# Patient Record
Sex: Female | Born: 1980 | Race: Black or African American | Hispanic: No | Marital: Married | Smoking: Never smoker
Health system: Southern US, Community
[De-identification: ages and names within clinical notes are randomized; demographics above are authoritative.]

---

## 2002-10-22 ENCOUNTER — Encounter: Payer: Self-pay | Admitting: Emergency Medicine

## 2002-10-22 ENCOUNTER — Emergency Department (HOSPITAL_COMMUNITY): Admission: EM | Admit: 2002-10-22 | Discharge: 2002-10-22 | Payer: Self-pay | Admitting: Emergency Medicine

## 2003-09-11 ENCOUNTER — Ambulatory Visit (HOSPITAL_COMMUNITY): Admission: RE | Admit: 2003-09-11 | Discharge: 2003-09-11 | Payer: Self-pay | Admitting: *Deleted

## 2008-05-23 ENCOUNTER — Ambulatory Visit: Payer: Self-pay | Admitting: Diagnostic Radiology

## 2008-05-23 ENCOUNTER — Emergency Department (HOSPITAL_BASED_OUTPATIENT_CLINIC_OR_DEPARTMENT_OTHER): Admission: EM | Admit: 2008-05-23 | Discharge: 2008-05-24 | Payer: Self-pay | Admitting: Emergency Medicine

## 2010-02-14 IMAGING — CT CT PELVIS W/ CM
2 of 5 series · 16 of 46 positions shown, 18 images · IV contrast (APPLIED)
Comparison: None

CT ABDOMEN

CLINICAL DATA: Lower abdominal and pelvic pain.  Vomiting.

CT ABDOMEN AND PELVIS WITH CONTRAST
TECHNIQUE: Multidetector CT imaging of the abdomen and pelvis was
performed using the standard protocol following bolus
administration of intravenous contrast.
Contrast: 100 ml [id number]-7JJ and oral contrast

[Series 2: abd/pelvis 5.0 b31f · axial · 0.83mm/px · z∈[-489,-49]mm · 13 of 104 slices shown, 15 images]
[im 8/104  soft-tissue]
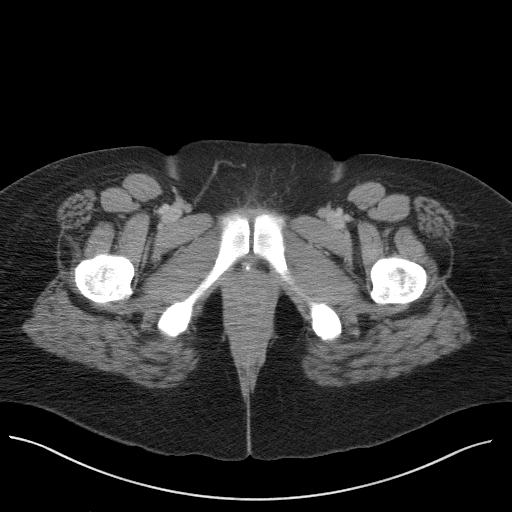
[im 8/104  bone]
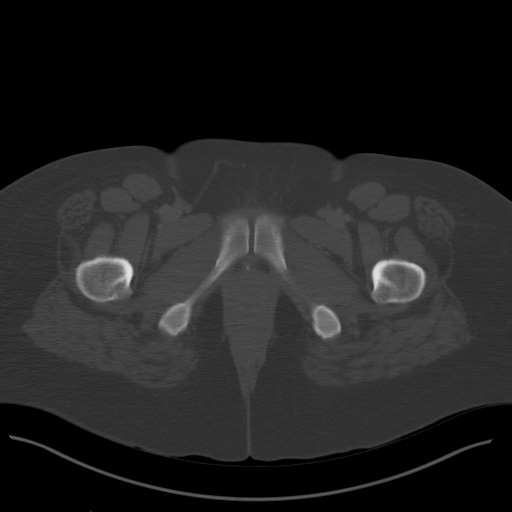
[im 15/104  soft-tissue]
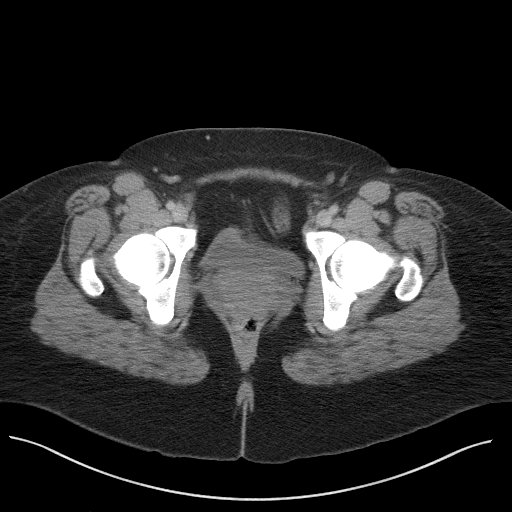
[im 23/104  soft-tissue]
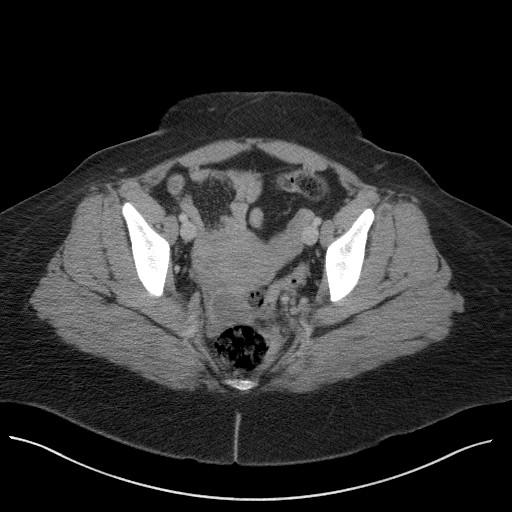
[im 30/104  soft-tissue]
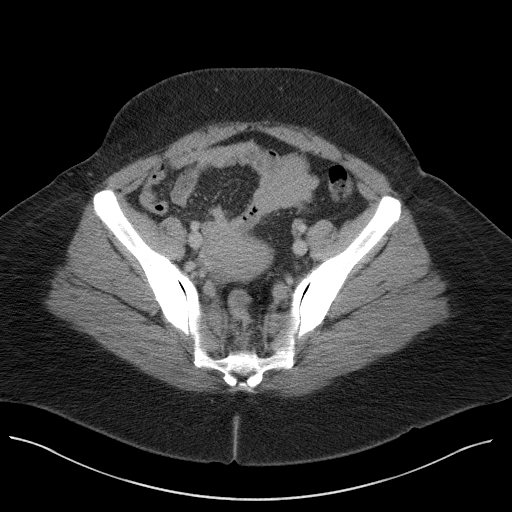
[im 37/104  soft-tissue]
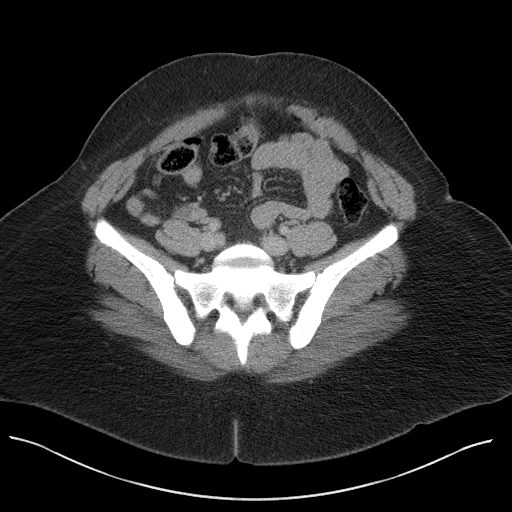
[im 45/104  soft-tissue]
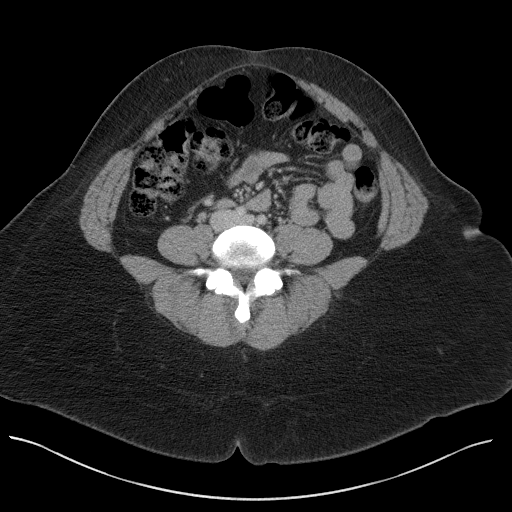
[im 52/104  soft-tissue]
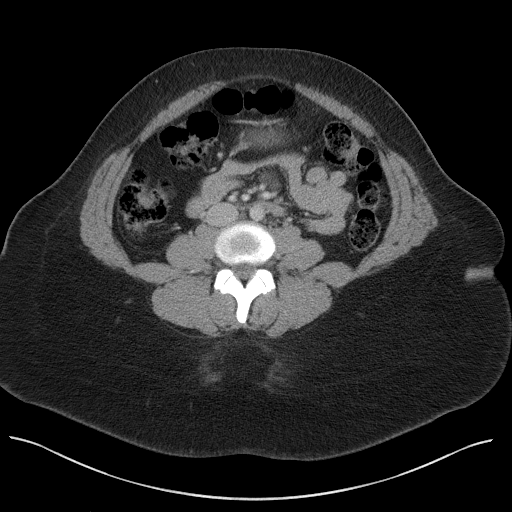
[im 59/104  soft-tissue]
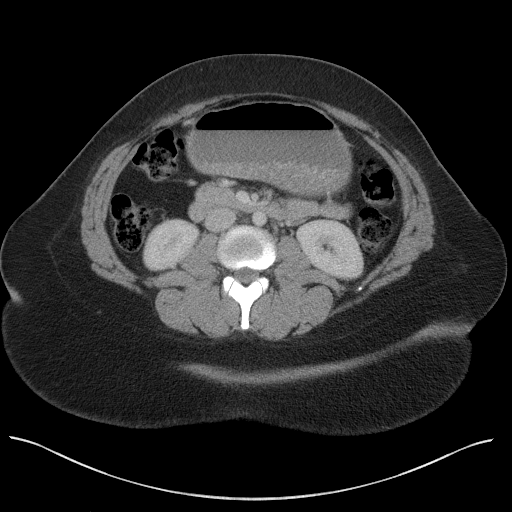
[im 67/104  soft-tissue]
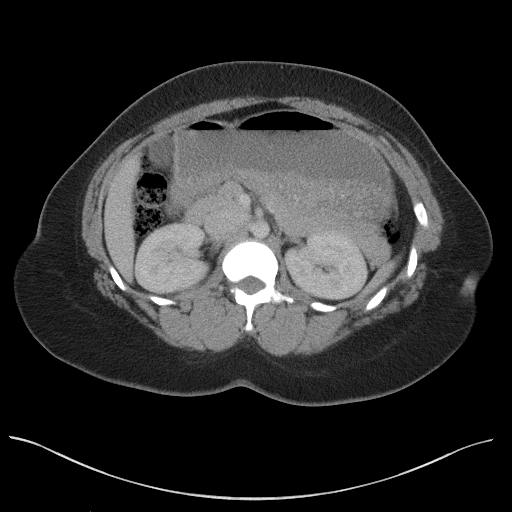
[im 67/104  bone]
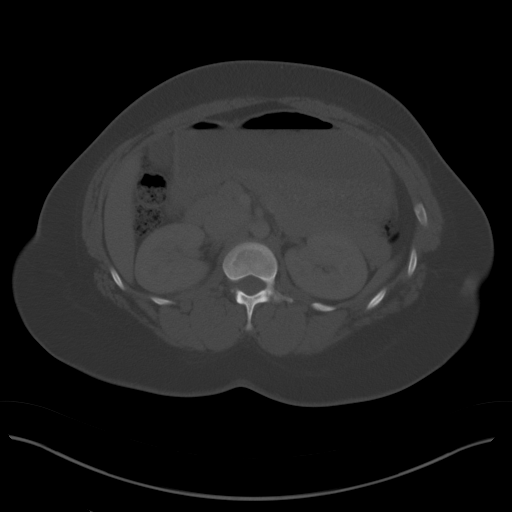
[im 74/104  soft-tissue]
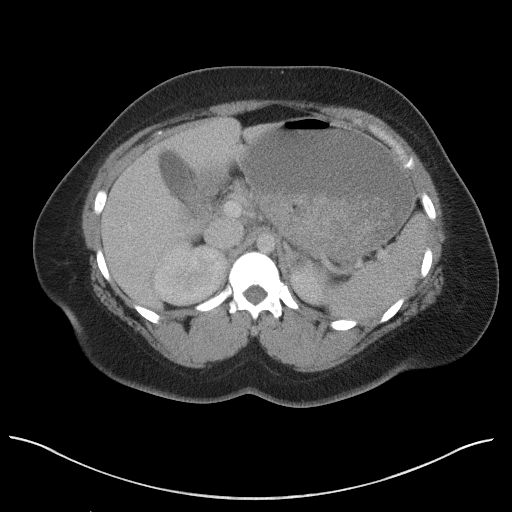
[im 81/104  soft-tissue]
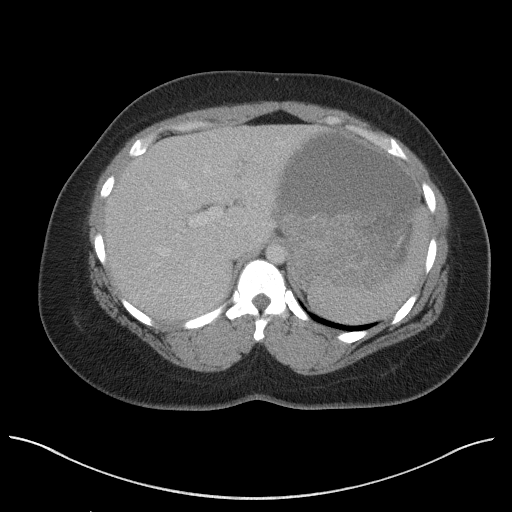
[im 89/104  soft-tissue]
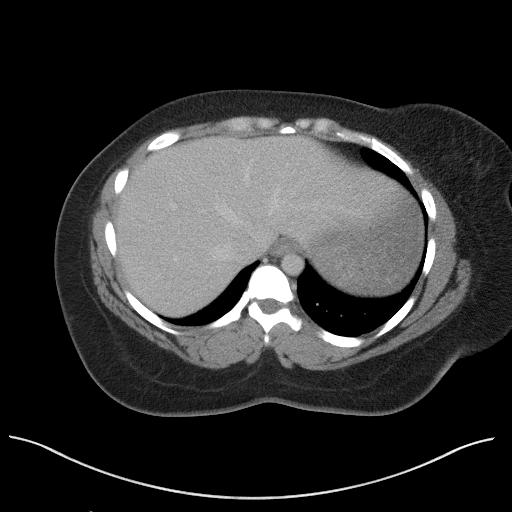
[im 96/104  soft-tissue]
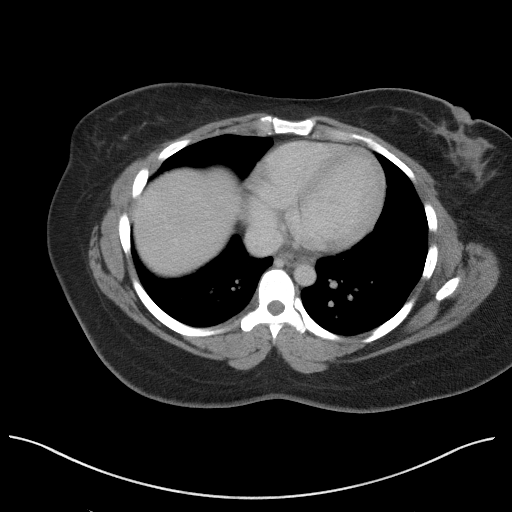

[Series 5: abd/pelvis 3.0 coronal · coronal · 0.95mm/px · 3 of 91 slices shown]
[im 31/91  soft-tissue]
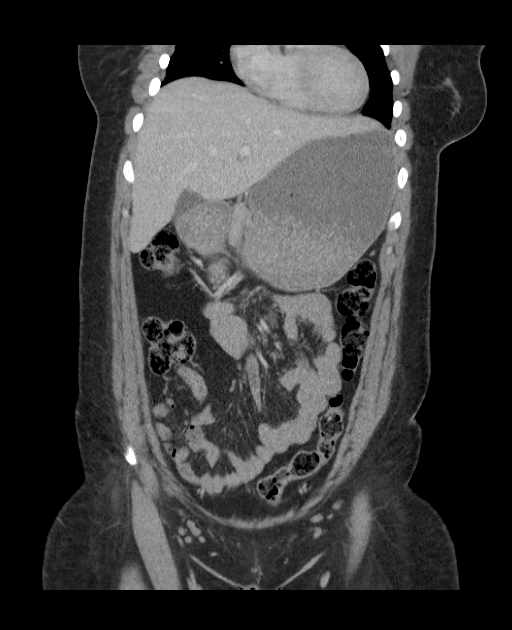
[im 41/91  soft-tissue]
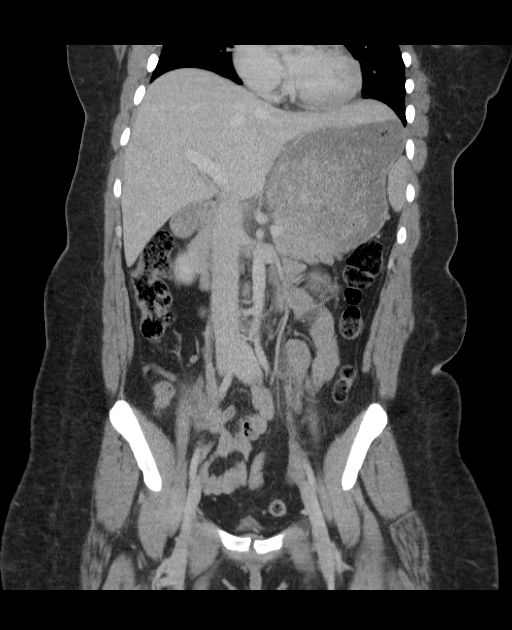
[im 51/91  soft-tissue]
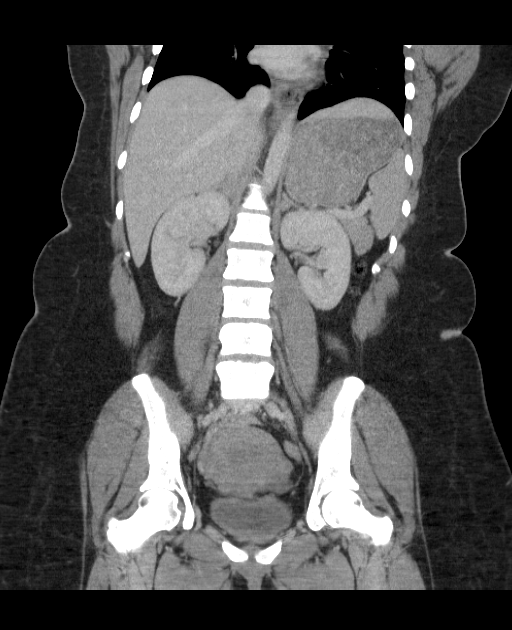

[16 of 46 positions shown; findings below may reference images not displayed]

FINDINGS: The abdominal parenchymal organs are normal appearance.
Gallbladder is unremarkable, and there is no evidence of
hydronephrosis.

Stomach is distended, however there is no evidence of dilated bowel
loops.  There is no evidence of mass or inflammatory process.  No
abnormal fluid collections are seen.
IMPRESSION: 1.  Distended stomach, without evidence of dilated bowel loops.
This is a nonspecific finding, however gastroparesis of gastric
outlet obstruction cannot definitely be excluded.
2.  Otherwise unremarkable abdomen CT.

CT PELVIS
FINDINGS: A unilocular cyst is seen in the right adnexa measuring
3.2 cm.  This likely represents a functional ovarian cyst.  There
is no evidence of free fluid.  No inflammatory process identified.
Normal appendix is visualized.  There is no evidence of pelvic soft
tissue masses.
IMPRESSION: 3.2 cm right adnexal cyst, likely representing a functional ovarian
cyst.  No evidence of free fluid or other significant abnormality
in the pelvis.

## 2010-02-16 ENCOUNTER — Emergency Department (HOSPITAL_BASED_OUTPATIENT_CLINIC_OR_DEPARTMENT_OTHER)
Admission: EM | Admit: 2010-02-16 | Discharge: 2010-02-16 | Payer: Self-pay | Source: Home / Self Care | Admitting: Emergency Medicine

## 2010-02-16 LAB — CBC
HCT: 35.3 % — ABNORMAL LOW (ref 36.0–46.0)
Hemoglobin: 11.4 g/dL — ABNORMAL LOW (ref 12.0–15.0)
MCV: 81.3 fL (ref 78.0–100.0)
RBC: 4.34 MIL/uL (ref 3.87–5.11)
RDW: 12.7 % (ref 11.5–15.5)
WBC: 6.2 10*3/uL (ref 4.0–10.5)

## 2010-02-16 LAB — BASIC METABOLIC PANEL
Calcium: 9.2 mg/dL (ref 8.4–10.5)
Chloride: 109 mEq/L (ref 96–112)
GFR calc Af Amer: >60 mL/min (ref >60)
GFR calc non Af Amer: >60 mL/min (ref >60)
Potassium: 4.3 mEq/L (ref 3.5–5.1)

## 2010-05-03 LAB — COMPREHENSIVE METABOLIC PANEL
ALT: 12 U/L (ref 0–35)
AST: 20 U/L (ref 0–37)
CO2: 26 mEq/L (ref 19–32)
Calcium: 9.5 mg/dL (ref 8.4–10.5)
Creatinine, Ser: 0.8 mg/dL (ref 0.4–1.2)
GFR calc Af Amer: >60 mL/min (ref >60)
GFR calc non Af Amer: >60 mL/min (ref >60)
Glucose, Bld: 114 mg/dL — ABNORMAL HIGH (ref 70–99)
Sodium: 141 mEq/L (ref 135–145)
Total Protein: 8.6 g/dL — ABNORMAL HIGH (ref 6.0–8.3)

## 2010-05-03 LAB — URINALYSIS, ROUTINE W REFLEX MICROSCOPIC
Leukocytes, UA: NEGATIVE
Nitrite: NEGATIVE
Specific Gravity, Urine: 1.034 — ABNORMAL HIGH (ref 1.005–1.030)
Urobilinogen, UA: 1 mg/dL (ref 0.0–1.0)
pH: 6.5 (ref 5.0–8.0)

## 2010-05-03 LAB — CBC
MCHC: 32.8 g/dL (ref 30.0–36.0)
MCV: 79.2 fL (ref 78.0–100.0)
RBC: 4.45 MIL/uL (ref 3.87–5.11)
RDW: 14.2 % (ref 11.5–15.5)

## 2010-05-03 LAB — DIFFERENTIAL
Lymphocytes Relative: 35 % (ref 12–46)
Lymphs Abs: 4 10*3/uL (ref 0.7–4.0)
Monocytes Relative: 7 % (ref 3–12)
Neutrophils Relative %: 53 % (ref 43–77)

## 2010-05-03 LAB — URINE MICROSCOPIC-ADD ON

## 2010-05-03 LAB — PREGNANCY, URINE: Preg Test, Ur: NEGATIVE

## 2010-05-03 LAB — WET PREP, GENITAL: Trich, Wet Prep: NONE SEEN

## 2010-05-03 LAB — GC/CHLAMYDIA PROBE AMP, GENITAL
Chlamydia, DNA Probe: NEGATIVE
GC Probe Amp, Genital: NEGATIVE

## 2010-05-03 LAB — LIPASE, BLOOD: Lipase: 85 U/L (ref 23–300)

## 2010-07-18 ENCOUNTER — Emergency Department (HOSPITAL_BASED_OUTPATIENT_CLINIC_OR_DEPARTMENT_OTHER)
Admission: EM | Admit: 2010-07-18 | Discharge: 2010-07-18 | Disposition: A | Discharge status: Home / Self Care | Payer: Medicaid Other | Attending: Emergency Medicine | Admitting: Emergency Medicine

## 2010-07-18 DIAGNOSIS — J039 Acute tonsillitis, unspecified: Secondary | ICD-10-CM | POA: Insufficient documentation

## 2011-03-09 ENCOUNTER — Emergency Department (HOSPITAL_BASED_OUTPATIENT_CLINIC_OR_DEPARTMENT_OTHER)
Admission: EM | Admit: 2011-03-09 | Discharge: 2011-03-10 | Disposition: A | Discharge status: Home / Self Care | Payer: Medicaid Other | Attending: Emergency Medicine | Admitting: Emergency Medicine

## 2011-03-09 ENCOUNTER — Encounter (HOSPITAL_BASED_OUTPATIENT_CLINIC_OR_DEPARTMENT_OTHER): Payer: Self-pay | Admitting: *Deleted

## 2011-03-09 DIAGNOSIS — H9209 Otalgia, unspecified ear: Secondary | ICD-10-CM | POA: Insufficient documentation

## 2011-03-09 DIAGNOSIS — K089 Disorder of teeth and supporting structures, unspecified: Secondary | ICD-10-CM | POA: Insufficient documentation

## 2011-03-09 DIAGNOSIS — K0889 Other specified disorders of teeth and supporting structures: Secondary | ICD-10-CM

## 2011-03-09 DIAGNOSIS — Z79899 Other long term (current) drug therapy: Secondary | ICD-10-CM | POA: Insufficient documentation

## 2011-03-09 MED ORDER — AMOXICILLIN-POT CLAVULANATE 875-125 MG PO TABS
1.0000 | ORAL_TABLET | Freq: Once | ORAL | Status: AC
  Administered 2011-03-09: 1 via ORAL
  Filled 2011-03-09: qty 1

## 2011-03-09 MED ORDER — AMOXICILLIN-POT CLAVULANATE 875-125 MG PO TABS: 1.0000 | ORAL_TABLET | Freq: Two times a day (BID) | ORAL | Status: AC

## 2011-03-09 MED ORDER — FLUTICASONE PROPIONATE 50 MCG/ACT NA SUSP
2.0000 | Freq: Every day | NASAL | Status: DC
  Filled 2011-03-09: qty 16

## 2011-03-09 MED ORDER — FLUTICASONE PROPIONATE 50 MCG/ACT NA SUSP: 2.0000 | Freq: Every day | NASAL | Status: AC

## 2011-03-09 NOTE — ED Notes (Signed)
C/o right tooth pain x 3 weeks which is now causing ear pain as well.

## 2011-03-09 NOTE — ED Notes (Signed)
Asked Pt. About if she has a Education officer, community.  Pt. Reports she has no dentist or PMD.  [facility]

## 2011-03-09 NOTE — ED Provider Notes (Signed)
History     CSN: [id number]  Arrival date & time 03/09/11  2157   First MD Initiated Contact with Patient 03/09/11 2324      Chief Complaint  Patient presents with  . Dental Pain  . Otalgia     HPI The patient presents with 3 weeks of pain about the right side of her face.  She notes her pain began gradually, focally in her right superior posterior teeth.  Since onset the pain has spread to encompass the rest of her face, including her ear on the right side.  The pain is throbbing, worse with chewing and air going into her ear.  The pain is not significant relief with OTC medications. No dysphagia no dysphagia, no dyspnea.  No fevers, no chills. History reviewed. No pertinent past medical history.  History reviewed. No pertinent past surgical history.  History reviewed. No pertinent family history.  History  Substance Use Topics  . Smoking status: Never Smoker   . Smokeless tobacco: Not on file  . Alcohol Use: No    OB History    Grav Para Term Preterm Abortions TAB SAB Ect Mult Living                  Review of Systems  All other systems reviewed and are negative.    Allergies  Review of patient's allergies indicates no known allergies.  Home Medications   Current Outpatient Rx  Name Route Sig Dispense Refill  . ACETAMINOPHEN 500 MG PO TABS Oral Take 1,500 mg by mouth every 6 (six) hours as needed. For pain    . ETONOGESTREL 68 MG [place] IMPL Subcutaneous Inject 1 each into the skin once. Implanted in 2011    . IBUPROFEN 200 MG PO TABS Oral Take 800 mg by mouth every 6 (six) hours as needed. For pain    . AMOXICILLIN-POT CLAVULANATE 875-125 MG PO TABS Oral Take 1 tablet by mouth 2 (two) times daily. 14 tablet 0    BP 140/77  Pulse 71  Temp(Src) 98.1 F (36.7 C) (Oral)  Resp 18  Ht 5\' 7"  (1.702 m)  Wt 256 lb (116.121 kg)  BMI 40.10 kg/m2  SpO2 100%  Physical Exam  Nursing note and vitals reviewed. Constitutional: She is oriented to person, place, and  time. She appears well-developed and well-nourished. No distress.  HENT:  Head: Normocephalic and atraumatic. No trismus in the jaw.  Right Ear: External ear and ear canal normal. No mastoid tenderness. Tympanic membrane is not injected, not scarred, not perforated, not erythematous, not retracted and not bulging. A middle ear effusion is present. No hemotympanum.  Left Ear: Tympanic membrane, external ear and ear canal normal.  No middle ear effusion.  Mouth/Throat: Uvula is midline, oropharynx is clear and moist and mucous membranes are normal. Abnormal dentition. Dental caries present. No dental abscesses or uvula swelling.  Eyes: Conjunctivae and EOM are normal.  Cardiovascular: Normal rate and regular rhythm.   Pulmonary/Chest: Effort normal and breath sounds normal. No stridor. No respiratory distress.  Musculoskeletal: She exhibits no edema.  Neurological: She is alert and oriented to person, place, and time. No cranial nerve deficit.  Skin: Skin is warm and dry.  Psychiatric: She has a normal mood and affect.    ED Course  Procedures (including critical care time)  Labs Reviewed - No data to display No results found.   1. Pain, dental   2. Ear pain       MDM  This  generally well female now presents with several weeks of pain about the right side of her face.  On exam she is in no distress, though there is a mild right ear effusion and abnormal dentition on the right.  There is no overt evidence of abscess, though there is some concern for periapical pathology.  This concern was shared with the patient and her colleague.  She was advised to get a cat scan was required to effectively rule out abscess.  The patient defers CAT scan, and will be discharged with antibiotics.  Explicit return precautions, specifically; return in 3 days for no improvement in her condition, or sooner if there is any deterioration, were provided.        Gerhard Munch, MD 03/09/11 [phone]

## 2011-03-09 NOTE — Discharge Instructions (Signed)
As we discussed, your condition is likely due to an infection of your sinuses.  However there is the possibility of this being an early dental infection with an abscess.  It is very important that you take your medications as directed.  And, as discussed, if you develop any new, or concerning changes in her condition please return to the emergency department.  You also need to follow up with a dentist.  Referral to our dental clinic has been provided

## 2011-04-28 ENCOUNTER — Encounter (HOSPITAL_BASED_OUTPATIENT_CLINIC_OR_DEPARTMENT_OTHER): Payer: Self-pay | Admitting: *Deleted

## 2011-04-28 ENCOUNTER — Emergency Department (HOSPITAL_BASED_OUTPATIENT_CLINIC_OR_DEPARTMENT_OTHER)
Admission: EM | Admit: 2011-04-28 | Discharge: 2011-04-28 | Disposition: A | Discharge status: Home / Self Care | Payer: Self-pay | Attending: Emergency Medicine | Admitting: Emergency Medicine

## 2011-04-28 DIAGNOSIS — K029 Dental caries, unspecified: Secondary | ICD-10-CM | POA: Insufficient documentation

## 2011-04-28 DIAGNOSIS — M26629 Arthralgia of temporomandibular joint, unspecified side: Secondary | ICD-10-CM

## 2011-04-28 MED ORDER — HYDROCODONE-ACETAMINOPHEN 5-325 MG PO TABS
1.0000 | ORAL_TABLET | Freq: Four times a day (QID) | ORAL | Status: AC | PRN
Start: 2011-04-28 — End: 2011-05-08

## 2011-04-28 MED ORDER — DIAZEPAM 5 MG PO TABS: 5.0000 mg | ORAL_TABLET | Freq: Three times a day (TID) | ORAL | Status: AC | PRN

## 2011-04-28 NOTE — ED Provider Notes (Signed)
History     CSN: [id number]  Arrival date & time 04/28/11  0300   First MD Initiated Contact with Patient 04/28/11 [phone]      Chief Complaint  Patient presents with  . Dental Pain    (Consider location/radiation/quality/duration/timing/severity/associated sxs/prior treatment) HPI Level 5 Caveat: crying, unable to cooperate. This is a 31 year old female with a history of dental pain for over a month. When asked where the pain is located she points to her right temporomandibular joint. When asked if any individual teeth hurt she states they all hurt and cannot localize the pain to any tooth. She states the pain is severe. The pain is worse with moving the jaw. She denies fevers. She's been taking over-the-counter medications without relief.  History reviewed. No pertinent past medical history.  History reviewed. No pertinent past surgical history.  No family history on file.  History  Substance Use Topics  . Smoking status: Never Smoker   . Smokeless tobacco: Not on file  . Alcohol Use: No    OB History    Grav Para Term Preterm Abortions TAB SAB Ect Mult Living                  Review of Systems  All other systems reviewed and are negative.    Allergies  Review of patient's allergies indicates no known allergies.  Home Medications   Current Outpatient Rx  Name Route Sig Dispense Refill  . ACETAMINOPHEN 500 MG PO TABS Oral Take 1,500 mg by mouth every 6 (six) hours as needed. For pain    . ETONOGESTREL 68 MG [place] IMPL Subcutaneous Inject 1 each into the skin once. Implanted in 2011    . FLUTICASONE PROPIONATE 50 MCG/ACT NA SUSP Nasal Place 2 sprays into the nose daily. 16 g 0  . IBUPROFEN 200 MG PO TABS Oral Take 800 mg by mouth every 6 (six) hours as needed. For pain      BP 143/77  Pulse 82  Temp(Src) 98.6 F (37 C) (Oral)  Resp 18  Ht 5\' 7"  (1.702 m)  Wt 255 lb (115.667 kg)  BMI 39.94 kg/m2  SpO2 100%  LMP 04/28/2011  Physical Exam General:  Well-developed, well-nourished female in no acute distress; appearance consistent with age of record HENT: normocephalic, atraumatic; carious right upper third molar without tenderness to percussion; tender right TMJ, worse on movement of jaw Eyes: pupils equal round and reactive to light; extraocular muscles intact Neck: supple Heart: regular rate and rhythm Lungs: Normal respiratory effort and excursion Abdomen: soft; nondistended Extremities: No deformity; full range of motion; pulses normal Neurologic: Awake, alert; motor function intact in all extremities and symmetric; no facial droop Skin: Warm and dry Psychiatric: Crying, covering face and head with towels    ED Course  Procedures (including critical care time)     MDM  The patient was advised that she needs to followup with a dentist. She was advised that the dentist on call will see her without payment up front providing she calls within 48 hours. This is the policy of the [facility] dental service.        Hanley Seamen, MD 04/28/11 [phone]

## 2011-04-28 NOTE — ED Notes (Signed)
C/o right sided toothache that radiates into the ear. Constant for over a month, worsened over the last few days. Denies fevers. Has been taking OTC meds without relief. Pt cannot afford a dentist.

## 2014-05-02 ENCOUNTER — Encounter (HOSPITAL_BASED_OUTPATIENT_CLINIC_OR_DEPARTMENT_OTHER): Payer: Self-pay

## 2014-05-02 ENCOUNTER — Emergency Department (HOSPITAL_BASED_OUTPATIENT_CLINIC_OR_DEPARTMENT_OTHER)
Admission: EM | Admit: 2014-05-02 | Discharge: 2014-05-02 | Disposition: A | Discharge status: Home / Self Care | Payer: Medicaid Other | Attending: Emergency Medicine | Admitting: Emergency Medicine

## 2014-05-02 DIAGNOSIS — K029 Dental caries, unspecified: Secondary | ICD-10-CM | POA: Diagnosis not present

## 2014-05-02 DIAGNOSIS — Z7951 Long term (current) use of inhaled steroids: Secondary | ICD-10-CM | POA: Insufficient documentation

## 2014-05-02 DIAGNOSIS — Z792 Long term (current) use of antibiotics: Secondary | ICD-10-CM | POA: Insufficient documentation

## 2014-05-02 DIAGNOSIS — K088 Other specified disorders of teeth and supporting structures: Secondary | ICD-10-CM | POA: Diagnosis present

## 2014-05-02 MED ORDER — CLINDAMYCIN HCL 300 MG PO CAPS: 300.0000 mg | ORAL_CAPSULE | Freq: Four times a day (QID) | ORAL | Status: AC

## 2014-05-02 MED ORDER — MAGIC MOUTHWASH: 5.0000 mL | Freq: Three times a day (TID) | ORAL | Status: AC

## 2014-05-02 MED ORDER — NAPROXEN 250 MG PO TABS
500.0000 mg | ORAL_TABLET | Freq: Once | ORAL | Status: AC
  Administered 2014-05-02: 500 mg via ORAL
  Filled 2014-05-02: qty 2

## 2014-05-02 MED ORDER — NAPROXEN 500 MG PO TABS: 500.0000 mg | ORAL_TABLET | Freq: Two times a day (BID) | ORAL | Status: AC

## 2014-05-02 MED ORDER — CLINDAMYCIN HCL 150 MG PO CAPS
300.0000 mg | ORAL_CAPSULE | Freq: Once | ORAL | Status: AC
  Administered 2014-05-02: 300 mg via ORAL
  Filled 2014-05-02: qty 2

## 2014-05-02 NOTE — ED Notes (Signed)
Pt reports left lower dental pain x 1 month.  No dental appt scheduled, states "i have no insurance."

## 2014-05-02 NOTE — ED Provider Notes (Signed)
CSN: [id number]     Arrival date & time 05/02/14  2244 History  This chart was scribed for Kristin Yankovich, MD by Tonye RoyaltyJoshua Chen, ED Scribe. This patient was seen in room MH01/MH01 and the patient's care was started at 11:02 PM.    Chief Complaint  Patient presents with  . Dental Pain   Patient is a 34 y.o. female presenting with tooth pain. The history is provided by the patient. No language interpreter was used.  Dental Pain Location:  Lower Lower teeth location:  17/LL 3rd molar and 18/LL 2nd molar Quality:  Unable to specify Severity:  Severe Onset quality:  Gradual Duration:  2 months Timing:  Intermittent Progression:  Worsening Chronicity:  New Context: dental caries   Previous work-up:  Dental exam Relieved by: Amoxicillin, Hydrocodone. Worsened by:  Nothing tried Ineffective treatments:  None tried Associated symptoms: no facial swelling, no neck swelling, no oral bleeding and no trismus   Risk factors: no smoking     HPI Comments: Kristin Cole is a 34 y.o. female who presents to the Emergency Department complaining of left lower dental pain, intermittent since 2 months ago. She states she saw a dentist who prescribed Hydrocodone and Amoxicillin which improved her symptoms, but they worsened again 3 days ago. She states she stopped using the Hydrocodone because it makes her sleepy. She reports headache to psterior head.   History reviewed. No pertinent past medical history. History reviewed. No pertinent past surgical history. No family history on file. History  Substance Use Topics  . Smoking status: Never Smoker   . Smokeless tobacco: Not on file  . Alcohol Use: No   OB History    No data available     Review of Systems  HENT: Positive for dental problem. Negative for facial swelling.   All other systems reviewed and are negative.     Allergies  Review of patient's allergies indicates no known allergies.  Home Medications   Prior to Admission  medications   Medication Sig Start Date End Date Taking? Authorizing Provider  amoxicillin (AMOXIL) 500 MG capsule Take 500 mg by mouth 3 (three) times daily.   Yes Historical Provider, MD  HYDROcodone-acetaminophen (NORCO/VICODIN) 5-325 MG per tablet Take 1 tablet by mouth every 6 (six) hours as needed for moderate pain.   Yes Historical Provider, MD  acetaminophen (TYLENOL) 500 MG tablet Take 1,500 mg by mouth every 6 (six) hours as needed. For pain    Historical Provider, MD  etonogestrel (IMPLANON) 68 MG IMPL implant Inject 1 each into the skin once. Implanted in 2011    Historical Provider, MD  fluticasone (FLONASE) 50 MCG/ACT nasal spray Place 2 sprays into the nose daily. 03/09/11 03/10/12  Gerhard Munchobert Lockwood, MD  ibuprofen (ADVIL,MOTRIN) 200 MG tablet Take 800 mg by mouth every 6 (six) hours as needed. For pain    Historical Provider, MD   BP 149/93 mmHg  Pulse 88  Temp(Src) 98.5 F (36.9 C) (Oral)  Resp 22  Ht 5\' 7"  (1.702 m)  Wt 253 lb (114.76 kg)  BMI 39.62 kg/m2  SpO2 100%  LMP 04/07/2014 Physical Exam  Constitutional: She is oriented to person, place, and time. She appears well-developed and well-nourished.  HENT:  Head: Normocephalic and atraumatic.  Mouth/Throat: Oropharynx is clear and moist. No oropharyngeal exudate.  3 caries to left lower wisdom, lower left first molar No abscess No trismus Trachea midline  Eyes: Conjunctivae and EOM are normal. Pupils are equal, round, and reactive to light.  Neck: Normal range of motion. Neck supple.  Intact phonation no facial swelling no swelling of the posterior oropharynx  Cardiovascular: Normal rate and regular rhythm.   Pulmonary/Chest: Effort normal and breath sounds normal. No respiratory distress. She has no wheezes. She has no rales.  Abdominal: Soft. Bowel sounds are normal. There is no tenderness.  Musculoskeletal: Normal range of motion.  Neurological: She is alert and oriented to person, place, and time.  Skin: Skin  is warm and dry.  Psychiatric: She has a normal mood and affect.  Nursing note and vitals reviewed.   ED Course  Procedures (including critical care time)  DIAGNOSTIC STUDIES: Oxygen Saturation is 100% on room air, normal by my interpretation.    COORDINATION OF CARE: 11:05 PM Discussed treatment plan with patient at beside, the patient agrees with the plan and has no further questions at this time.   Labs Review Labs Reviewed - No data to display  Imaging Review No results found.   EKG Interpretation None      MDM  Referral made to Dr Barbette Merino with Dentistry.  Final diagnoses:  None    Has hydrocodone and amoxicillin will change to clindamycin and naproxen and make a referral.    I personally performed the services described in this documentation, which was scribed in my presence. The recorded information has been reviewed and is accurate.    Cy Blamer, MD 05/02/14 [phone]

## 2014-05-02 NOTE — Discharge Instructions (Signed)
Dental Care and Dentist Visits °Dental care supports good overall health. Regular dental visits can also help you avoid dental pain, bleeding, infection, and other more serious health problems in the future. It is important to keep the mouth healthy because diseases in the teeth, gums, and other oral tissues can spread to other areas of the body. Some problems, such as diabetes, heart disease, and pre-term labor have been associated with poor oral health.  °See your dentist every 6 months. If you experience emergency problems such as a toothache or broken tooth, go to the dentist right away. If you see your dentist regularly, you may catch problems early. It is easier to be treated for problems in the early stages.  °WHAT TO EXPECT AT A DENTIST VISIT  °Your dentist will look for many common oral health problems and recommend proper treatment. At your regular dental visit, you can expect: °· Gentle cleaning of the teeth and gums. This includes scraping and polishing. This helps to remove the sticky substance around the teeth and gums (plaque). Plaque forms in the mouth shortly after eating. Over time, plaque hardens on the teeth as tartar. If tartar is not removed regularly, it can cause problems. Cleaning also helps remove stains. °· Periodic X-rays. These pictures of the teeth and supporting bone will help your dentist assess the health of your teeth. °· Periodic fluoride treatments. Fluoride is a natural mineral shown to help strengthen teeth. Fluoride treatment involves applying a fluoride gel or varnish to the teeth. It is most commonly done in children. °· Examination of the mouth, tongue, jaws, teeth, and gums to look for any oral health problems, such as: °¨ Cavities (dental caries). This is decay on the tooth caused by plaque, sugar, and acid in the mouth. It is best to catch a cavity when it is small. °¨ Inflammation of the gums caused by plaque buildup (gingivitis). °¨ Problems with the mouth or malformed  or misaligned teeth. °¨ Oral cancer or other diseases of the soft tissues or jaws.  °KEEP YOUR TEETH AND GUMS HEALTHY °For healthy teeth and gums, follow these general guidelines as well as your dentist's specific advice: °· Have your teeth professionally cleaned at the dentist every 6 months. °· Brush twice daily with a fluoride toothpaste. °· Floss your teeth daily.  °· Ask your dentist if you need fluoride supplements, treatments, or fluoride toothpaste. °· Eat a healthy diet. Reduce foods and drinks with added sugar. °· Avoid smoking. °TREATMENT FOR ORAL HEALTH PROBLEMS °If you have oral health problems, treatment varies depending on the conditions present in your teeth and gums. °· Your caregiver will most likely recommend good oral hygiene at each visit. °· For cavities, gingivitis, or other oral health disease, your caregiver will perform a procedure to treat the problem. This is typically done at a separate appointment. Sometimes your caregiver will refer you to another dental specialist for specific tooth problems or for surgery. °SEEK IMMEDIATE DENTAL CARE IF: °· You have pain, bleeding, or soreness in the gum, tooth, jaw, or mouth area. °· A permanent tooth becomes loose or separated from the gum socket. °· You experience a blow or injury to the mouth or jaw area. °Document Released: 09/21/2010 Document Revised: 04/03/2011 Document Reviewed: 09/21/2010 °ExitCare® Patient Information ©2015 [employer]. This information is not intended to replace advice given to you by your health care provider. Make sure you discuss any questions you have with your health care provider. ° °Emergency Department Resource Guide °1) Find a Doctor   and Pay Out of Pocket °Although you won't have to find out who is covered by your insurance plan, it is a good idea to ask around and get recommendations. You will then need to call the office and see if the doctor you have chosen will accept you as a new patient and what types of  options they offer for patients who are self-pay. Some doctors offer discounts or will set up payment plans for their patients who do not have insurance, but you will need to ask so you aren't surprised when you get to your appointment. ° °2) Contact Your Local Health Department °Not all health departments have doctors that can see patients for sick visits, but many do, so it is worth a call to see if yours does. If you don't know where your local health department is, you can check in your phone book. The CDC also has a tool to help you locate your state's health department, and many state websites also have listings of all of their local health departments. ° °3) Find a Walk-in Clinic °If your illness is not likely to be very severe or complicated, you may want to try a walk in clinic. These are popping up all over the country in pharmacies, drugstores, and shopping centers. They're usually staffed by nurse practitioners or physician assistants that have been trained to treat common illnesses and complaints. They're usually fairly quick and inexpensive. However, if you have serious medical issues or chronic medical problems, these are probably not your best option. ° °No Primary Care Doctor: °- Call [facility] at  832-8000 - they can help you locate a primary care doctor that  accepts your insurance, provides certain services, etc. °- Physician Referral Service- 1-800-533-3463 ° °Chronic Pain Problems: °Organization         Address  Phone   Notes  °[facility]  (336) 297-2271 Patients need to be referred by their primary care doctor.  ° °Medication Assistance: °Organization         Address  Phone   Notes  °[place] [facility] [address] °[place], [place] [address] (336) 641-8030 --Must be a resident of [place] °-- Must have NO insurance coverage whatsoever (no Medicaid/ Medicare, etc.) °-- The pt. MUST have a primary care doctor that directs  their care regularly and follows them in the community °  °[facility]  (866) 331-1348   °[employer]  (888) 892-1162   ° °Agencies that provide inexpensive medical care: °Organization         Address  Phone   Notes  °[facility]  (336) 832-8035   °[facility]    (336) 832-7272   °[facility] [address] °[place], [place] [address] (336) 832-4777   °[facility] [address], °[place] (336) 271-4999   °[facility]    (336) 373-0678   °[facility]    (336) 272-1050   °[facility] ° [address], [place] Phone:  (336) 832-4444, Fax:  (336) 832-4440 Hours of Operation:  9 am - 6 pm, M-F.  Also accepts Medicaid/Medicare and self-pay.  °[facility] ° [address],  Phone: (336) 832-3150, Fax: (336) 832-3151. Hours of Operation:  8:30 am - 5:30 pm, M-F.  Also accepts Medicaid and self-pay.  °[facility] [place] [address], [place] Phone: (336) 878-6027   °  [facility] [address], [place], [place] (336)723-1848, Ext. 123 Mondays & Thursdays: 7-9 AM.  First 15 patients are seen on a first come, first serve basis. °  ° °Medicaid-accepting [place] Providers: ° °Organization         Address  Phone   Notes  °[facility] [address], [place] (336) 641-2100 Also accepts self-pay patients.  °[facility] [address], [place] ° (336) 856-9996   °[facility] [address], [place] (336) 288-8857   °[facility] [address] [place] [address], [place] (336) 299-7000   °Veita Bland [address], [place]  ° (336) 373-1557 Only accepts [place] Access Medicaid patients after they have their name applied to their card.  ° °Self-Pay (no insurance) in [place]: ° °Organization          Address  Phone   Notes  °Sickle Cell Patients, [facility] [address], [place] (336) 832-1970   °[facility] [address], [place] (336) 832-4400   °[facility] ° [address], [place] (336) 992-4800   °[facility]/Dr. Osei-Bonsu ° [address] [place] [address], [place] or [address], [place] (336) 841-8500 Phone number for both [place] and [place] locations is the same.  °[facility] [address], [place] (336) 299-0000   °[facility] [address] [place] [address], [place] or [address] (336) 852-7530 °(336) 878-2260   °[facility] [address], [place] (336) 350-1642, phone; (336) 294-5005, fax Sees patients 1st and 3rd Saturday of every month.  Must not qualify for public or private insurance (i.e. Medicaid, Medicare, [place] Health Choice, Veterans' Benefits) • Household income should be no more than 200% of the poverty level •The clinic cannot treat you if you are pregnant or think you are pregnant • Sexually transmitted diseases are not treated at the clinic.  ° ° °Dental Care: °Organization         Address  Phone  Notes  °[facility] [address], [place] (336) 641-6152 Accepts children up to age 21 who are enrolled in Medicaid or [place] Health Choice; pregnant women with a Medicaid card; and children who have applied for Medicaid or [place] Health Choice, but were declined, whose parents can pay a reduced fee at time of service.  °[facility] [place]  [address], [place] (336) 641-7733 Accepts children up to age 21 who are enrolled in Medicaid or [place] Health Choice; pregnant women with a Medicaid card; and children who have applied for Medicaid or [place] Health Choice, but were declined, whose parents can pay a reduced fee at time of  service.  °[facility] ° [address], [place] (336) 641-4533 Patients are seen by appointment only. Walk-ins are not accepted. [facility] will see patients 18 years of age and older. °Monday - Tuesday (8am-5pm) °Most Wednesdays (8:30-5pm) °$30 per visit, cash only  °[facility] ° [address], [place] (336) 641-4533 Patients are seen by appointment only. Walk-ins are not accepted. [facility] will see patients 18 years of age and older. °One Wednesday Evening (Monthly: Volunteer Based).  $30 per visit,   cash only  °[facility]  (919) 537-3737 for adults; Children under age 4, call Graduate Pediatric Dentistry at ([phone]) [phone]. Children aged 4-14, please call ([phone]) [phone] to request a pediatric application. ° Dental services are provided in all areas of dental care including fillings, crowns and bridges, complete and partial dentures, implants, gum treatment, root canals, and extractions. Preventive care is also provided. Treatment is provided to both adults and children. °Patients are selected via a lottery and there is often a waiting list. °  °[facility] [address], °[place] ° (336) 763-8833 [email or web address] °  °[facility] [address], [place], [place] (336)723-1848, Ext. 123 Second and Fourth Thursday of each month, opens at 6:30 AM; Clinic ends at 9 AM.  Patients are seen on a first-come first-served basis, and a limited number are seen during each clinic.  ° °[facility] ° [address], [place], [place] (336) 723-7904   Eligibility Requirements °You must have lived in [place], [place], or [place] counties for at least the last three months. °  You cannot be eligible for state or federal sponsored healthcare insurance, including Veterans Administration, Medicaid, or Medicare. °  You generally cannot be eligible for healthcare insurance through your employer.   °  How to apply: °Eligibility screenings are held every Tuesday and Wednesday afternoon from 1:00 pm until 4:00 pm. You do not need an appointment for the interview!  °[facility] [address], [place], [place] 336-631-2330   °[facility]  336-342-8273   °[facility]  336-703-3100   °[facility]  336-570-6415   ° °Behavioral Health Resources in the Community: °Intensive Outpatient Programs °Organization         Address  Phone  Notes  °[facility] [address], [place], [place] 336-878-6098   °[facility] [address], [place], [place] 336-832-9800   °[facility] [address], [place], [place] ° 336-882-2125   °[facility] [address],  °[place], [place] 1-800-853-5163 or 336-641-4981   °Substance Abuse Resources °Organization         Address  Phone  Notes  °[facility]  336-882-2125   °[facility]  336-784-9470   °[facility]  336-285-9073   °[facility]  336-845-3988   °Residential & Outpatient Substance Abuse Program  1-800-659-3381   °Psychological Services °Organization         Address  Phone  Notes  °[facility]  336- 832-9600   °[facility]  336- 378-7881   °[facility] [address], [place] 1-800-853-5163 or 336-641-4981   ° °Mobile Crisis Teams °Organization         Address  Phone  Notes  °[facility], Mobile Crisis Care Unit  1-877-626-1772   °Assertive °[facility] ° [address] [place], [place] 336-834-9664   °Sharon DeEsch [address] °[place] 336-554-5454   ° °Self-Help/Support Groups °Organization         Address  Phone             Notes  °[facility] [place] - variety of support groups  336- 373-1402 Call for more information  °Narcotics Anonymous (NA), [facility] [address], °[place]  2 meetings at this location  ° °Residential Treatment Programs °Organization           Address  Phone  Notes  °[facility] [address],    °[place]  1-866-801-8205   °[facility] ° [address], [place], [place] 704-293-8524   °[facility] [address], [place] 336-845-3988 Admissions: 8am-3pm M-F  °[facility] [address]    °[place], [place] 336-841-1104   °[facility] [address], [place], [place] 336-379-7146   °[facility] [address]  °[place], [place] 336-285-9073   °[facility] Intensive Outpatient [address], [place], [place] 336-852-3033   °[facility] [address]  °[place], [place] 1-877-615-2722 or 336-784-9470   °[facility] [address] [place], [place] 336-227-7417 Accepts Medicaid  °[facility] [address]  °[place] 1-800-659-3381 Substance Abuse/Addiction Treatment  ° °[place] Behavioral Health Resources °Organization         Address  Phone  Notes  °[facility]  (888) 581-9988   °Julie Brannon, PhD [address] [place], [place]   (336) 349-5553 or (336) 951-0000   °[facility]   [address] °[place], [place] (336) 349-4454   °[facility] [address], [place], [place] (336) 342-8316 Insurance/Medicaid/sponsorship through [facility]  °[facility] [address]                                    [place], [place] (336) 342-8316 Therapy/tele-psych/case  °[facility] [address]  ° [place],  (336) 349-2233    °Dr. Arfeen  (336) 349-4544   °[facility] [place]  [employer] [place] [facility] 1) [address], [place] °2) [address], [place] °3)  [address], [place] (336) 349-3220 °(336) 342-7768 ° °(336) 342-8140   °[place] [facility]  [facility] (336) 342-1394 or (336) 342-3537 (After Hours)    ° °

## 2014-05-04 NOTE — ED Notes (Signed)
Patient called to get referral for oral surgeon.  Stated that the referral given will not accept patient without full payment up front.  Referral given for [facility], [facility], and  [facility].
